# Patient Record
Sex: Male | Born: 2000 | Race: White | Hispanic: No | Marital: Married | Smoking: Never smoker
Health system: Southern US, Community
[De-identification: ages and names within clinical notes are randomized; demographics above are authoritative.]

## PROBLEM LIST (undated history)

## (undated) HISTORY — PX: TONSILLECTOMY: SUR1361

## (undated) HISTORY — PX: ADENOIDECTOMY: SUR15

---
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
## 2021-04-14 ENCOUNTER — Other Ambulatory Visit: Payer: Self-pay

## 2022-03-10 ENCOUNTER — Ambulatory Visit (INDEPENDENT_AMBULATORY_CARE_PROVIDER_SITE_OTHER): Payer: BC Managed Care – PPO

## 2022-03-10 ENCOUNTER — Ambulatory Visit
Admission: RE | Admit: 2022-03-10 | Discharge: 2022-03-10 | Disposition: A | Discharge status: Home / Self Care | Payer: BC Managed Care – PPO | Source: Ambulatory Visit | Attending: Internal Medicine | Admitting: Internal Medicine

## 2022-03-10 VITALS — BP 116/75 | HR 68 | Temp 98.0°F | Resp 18 | Wt 133.0 lb

## 2022-03-10 DIAGNOSIS — R5383 Other fatigue: Secondary | ICD-10-CM

## 2022-03-10 DIAGNOSIS — R198 Other specified symptoms and signs involving the digestive system and abdomen: Secondary | ICD-10-CM

## 2022-03-10 DIAGNOSIS — R1032 Left lower quadrant pain: Secondary | ICD-10-CM

## 2022-03-10 LAB — GASTROINTESTINAL PANEL BY PCR, STOOL (REPLACES STOOL CULTURE)

## 2022-03-10 LAB — URINALYSIS, ROUTINE W REFLEX MICROSCOPIC
Glucose, UA: NEGATIVE mg/dL
Hgb urine dipstick: NEGATIVE
Ketones, ur: NEGATIVE mg/dL
Leukocytes,Ua: NEGATIVE
Nitrite: NEGATIVE
Protein, ur: 30 mg/dL — AB
Specific Gravity, Urine: 1.02 (ref 1.005–1.030)
pH: 7 (ref 5.0–8.0)

## 2022-03-10 LAB — URINALYSIS, MICROSCOPIC (REFLEX)
Bacteria, UA: NONE SEEN
Squamous Epithelial / HPF: NONE SEEN (ref 0–5)

## 2022-03-10 MED ORDER — DICYCLOMINE HCL 10 MG PO CAPS: 10.0000 mg | ORAL_CAPSULE | Freq: Three times a day (TID) | ORAL | 0 refills | Status: DC

## 2022-03-10 NOTE — Discharge Instructions (Addendum)
Abdominal xray and urinalysis today were unremarkable.  Symptoms and exam are without immediate danger signs; a stool panel might be helpful and I have placed an order for this tomorrow.  Please establish care with a primary care physician--Dr Rosette Reveal upstairs in this building has appointments in the next few weeks.  He can help guide further evaluation.  Prescription for dicyclomine (for cramping) was sent to the pharmacy; take as needed.  Note for work today/tomorrow. ?

## 2022-03-10 NOTE — ED Notes (Signed)
Patient is resting comfortably. Family at bedside.  

## 2022-03-10 NOTE — ED Provider Notes (Signed)
?[facility] ? ? ? ?CSN: [id number] ?Arrival date & time: 03/10/22  1256 ? ? ?  ? ?History   ?Chief Complaint ?Chief Complaint  ?Patient presents with  ? Abdominal Pain  ?  Entered by patient  ? Appointment  ?  1300  ? ? ?HPI ?James Roth is a 21 y.o. male.  He presents today with a 3-week history of abdominal pain, initially was associated with a lot of nausea, located in epigastrium, has migrated over time to the left lower quadrant.  Stool pattern has been unusual for him since that time, with increased frequency and decreased consistency of bowel movements.   ? ?Stool consistency is somewhat variable, might be watery, might be solid.  Stools are not hard.  1-6 BMs per day.  Sometimes there is a little blood on the stool, and sometimes there is a little blood on the toilet paper.  He has not had this before now.   ? ?The left lower quadrant discomfort is sharp and crampy, fairly constant, but is worse around the time of a bowel movement.  Nothing really helps it.   ? ?He is not vomiting, and current level of nausea is not too different from baseline. ?No fever.  Not coughing, no runny/congested nose.  No dysuria, no urinary frequency, no urethral discharge. ? ?He is having fatigue.  He has poor appetite, and has lost 10 pounds in the last 3 weeks.  He has some achiness of both knees.  No rash or skin change.   ? ?He has no primary care provider ? ?His sister is having similar symptoms, but less severe.  She is being worked up for gluten sensitivity/allergy. ? ? ?Abdominal Pain ? ?History reviewed. No pertinent past medical history. ? ?Patient Active Problem List  ? Diagnosis Date Noted  ? Atypical chest pain 09/11/2020  ? ? ?Past Surgical History:  ?Procedure Laterality Date  ? ADENOIDECTOMY    ? TONSILLECTOMY    ? ? ? ? ? ?Home Medications   ? ?Takes no medications regularly ? ?Family History ?Family History  ?Problem Relation Age of Onset  ? Healthy Mother   ? Healthy Father   ? ? ?Social  History ?Social History  ? ?Tobacco Use  ? Smoking status: Never  ? Smokeless tobacco: Never  ?Vaping Use  ? Vaping Use: Never used  ?Substance Use Topics  ? Alcohol use: Not Currently  ? Drug use: Not Currently  ? ? ? ?Allergies   ?Penicillins ? ? ?Review of Systems ?Review of Systems  ?Gastrointestinal:  Positive for abdominal pain.   See HPI also ? ? ?Physical Exam ?Triage Vital Signs ?ED Triage Vitals  ?Enc Vitals Group  ?   BP 03/10/22 1431 116/75  ?   Pulse Rate 03/10/22 1431 68  ?   Resp 03/10/22 1431 18  ?   Temp 03/10/22 1431 98 ?F (36.7 ?C)  ?   Temp Source 03/10/22 1431 Oral  ?   SpO2 03/10/22 1431 100 %  ?   Weight 03/10/22 1430 133 lb (60.3 kg)  ?   Height --   ?   Pain Score 03/10/22 1430 5  ?   Pain Loc --   ? ?Updated Vital Signs ?BP 116/75 (BP Location: Left Arm)   Pulse 68   Temp 98 ?F (36.7 ?C) (Oral)   Resp 18   Wt 60.3 kg   SpO2 100%   BMI 19.08 kg/m?  ? ?Physical Exam ?Constitutional:   ?  General: He is not in acute distress. ?   Appearance: He is not ill-appearing or toxic-appearing.  ?   Comments: Good hygiene  ?Eyes:  ?   Conjunctiva/sclera:  ?   Right eye: Right conjunctiva is not injected. No exudate. ?   Left eye: Left conjunctiva is not injected. No exudate. ?   Comments:  Conjugate gaze observed  ?Cardiovascular:  ?   Rate and Rhythm: Normal rate and regular rhythm.  ?Pulmonary:  ?   Effort: Pulmonary effort is normal. No respiratory distress.  ?   Breath sounds: No wheezing or rhonchi.  ?Abdominal:  ?   General: There is no distension.  ?   Palpations: Abdomen is soft.  ?   Tenderness: There is no guarding or rebound.  ? ? ?   Comments: Mild tenderness to deep palpation as marked in the diagram, in the right lower quadrant  ?Musculoskeletal:  ?   Cervical back: Neck supple.  ?   Comments: Walked into the urgent care independently  ?Skin: ?   General: Skin is warm and dry.  ?   Comments: No cyanosis  ?Neurological:  ?   Mental Status: He is alert.  ?   Comments: Face is  symmetric, speech is clear, coherent, logical  ? ? ?UC Treatments / Results  ?Labs ?(all labs ordered are listed, but only abnormal results are displayed) ?Labs Reviewed  ?URINALYSIS, ROUTINE W REFLEX MICROSCOPIC - Abnormal; Notable for the following components:  ?    Result Value  ? Bilirubin Urine SMALL (*)   ? Protein, ur 30 (*)   ? All other components within normal limits  ?GASTROINTESTINAL PANEL BY PCR, STOOL (REPLACES STOOL CULTURE)  ?URINALYSIS, MICROSCOPIC (REFLEX)  ? ?Urinalysis 0-5 RBC, 0-5 WBC, no bacteria, no squames, positive amorphous crystals. ? ?GI Panel (stool)--negative for all assessed pathogens ? ?EKG ?NA ? ?Radiology ?DG Abd Acute W/Chest ? ?Result Date: 03/10/2022 ?CLINICAL DATA:  Left lower quadrant pain for 3 weeks, fatigue, weight loss EXAM: DG ABDOMEN ACUTE WITH 1 VIEW CHEST COMPARISON:  09/10/2020 FINDINGS: Supine and upright frontal views of the abdomen and pelvis are obtained as well as an upright frontal view of the chest. Cardiac silhouette is unremarkable. No airspace disease, effusion, or pneumothorax. Bowel gas pattern is unremarkable without obstruction or ileus. No masses or abnormal calcifications. No free gas in the greater peritoneal sac. There are no acute or destructive bony lesions. IMPRESSION: 1. Unremarkable abdominal series. Electronically Signed   By: Sharlet Salina M.D.   On: 03/10/2022 15:29   ? ?Procedures ?Procedures (including critical care time) ?NA ? ?Medications Ordered in UC ?Medications - No data to display ?NA ? ?Final Clinical Impressions(s) / UC Diagnoses  ? ?Final diagnoses:  ?Left lower quadrant abdominal pain  ?Change in bowel movement  ? ? ? ?Discharge Instructions   ? ?  ?Abdominal xray and urinalysis today were unremarkable.  Symptoms and exam are without immediate danger signs; a stool panel might be helpful and I have placed an order for this tomorrow.  Please establish care with a primary care physician--Dr Joseph Berkshire upstairs in this building  has appointments in the next few weeks.  He can help guide further evaluation.  Prescription for dicyclomine (for cramping) was sent to the pharmacy; take as needed.  Note for work today/tomorrow. ? ? ? ? ?ED Prescriptions   ? ? Medication Sig Dispense Auth. Provider  ? dicyclomine (BENTYL) 10 MG capsule Take 1 capsule (10 mg total) by  mouth 3 (three) times daily before meals for 90 doses. 90 capsule Isa Rankin, MD  ? ?  ? ?PDMP not reviewed this encounter. ?  ?Isa Rankin, MD ?03/11/22 1258 ? ?

## 2022-03-10 NOTE — ED Triage Notes (Signed)
Patient presents to Urgent Care with complaints of abdominal pain, intermittent diarrhea, weight loss about 10 lbs, fatigue x 3 weeks ago. Pt states pain started at the center of abdominal area and now moved to LLQ radiating to the left flank area. Not treating symptoms with OTC meds.  ? ?Denies nausea, fever, or urinary symptoms.  ?

## 2022-03-16 ENCOUNTER — Telehealth: Payer: Self-pay | Admitting: Family Medicine

## 2022-03-16 ENCOUNTER — Telehealth: Payer: Self-pay

## 2022-03-16 NOTE — Telephone Encounter (Signed)
Spoke to mom concerning pt's status. He is still having abdominal pain stating that "it feels like someone punching me in my gut" when the pain comes. She has been advised to go to [facility] for further eval. Abdominal pain possible CBC and CT scan. Mom voiced understanding ?

## 2022-03-16 NOTE — Telephone Encounter (Signed)
Copied from CRM [id number]. Topic: General - Other ?>> Mar 16, 2022 11:09 AM Marylen Ponto wrote: ?Reason for CRM: Lanier Prude requests that Delice Bison return her call at [phone] ?

## 2022-05-08 ENCOUNTER — Encounter: Payer: Self-pay | Admitting: Family Medicine

## 2022-05-08 ENCOUNTER — Ambulatory Visit: Payer: 59 | Admitting: Family Medicine

## 2022-05-08 VITALS — BP 110/78 | HR 78 | Ht 69.0 in | Wt 136.6 lb

## 2022-05-08 DIAGNOSIS — Z0000 Encounter for general adult medical examination without abnormal findings: Secondary | ICD-10-CM

## 2022-05-08 DIAGNOSIS — Z1159 Encounter for screening for other viral diseases: Secondary | ICD-10-CM

## 2022-05-08 DIAGNOSIS — Z13220 Encounter for screening for lipoid disorders: Secondary | ICD-10-CM | POA: Diagnosis not present

## 2022-05-08 DIAGNOSIS — N50819 Testicular pain, unspecified: Secondary | ICD-10-CM

## 2022-05-08 DIAGNOSIS — R7989 Other specified abnormal findings of blood chemistry: Secondary | ICD-10-CM

## 2022-05-08 DIAGNOSIS — Z114 Encounter for screening for human immunodeficiency virus [HIV]: Secondary | ICD-10-CM | POA: Diagnosis not present

## 2022-05-08 NOTE — Assessment & Plan Note (Signed)
Atraumatic onset for a few weeks, localized left scrotal, can feel "bump", pain not aggravated by physical activity and mot noted when sitting inactive. Denies any preceding new exposures or activities to symptoms. No dysuria or masses noted. His findings are most consistent with possible spermatocele vs epididymal irritation. Plan for supportive care and he is to contact for any worsening or recalcitrant symptoms.

## 2022-05-08 NOTE — Patient Instructions (Addendum)
-   Obtain fasting labs with orders provided (can have water or black coffee but otherwise no food or drink x 8 hours before labs) - Can use heat alternating with ice and OTC medications for scrotal pain - If worsening or not improving, contact our office - Review information provided - Attend eye doctor annually, dentist every 6 months, work towards or maintain 30 minutes of moderate intensity physical activity at least 5 days per week, and consume a balanced diet - Return in 1 year for physical - Contact us for any questions between now and then

## 2022-05-08 NOTE — Assessment & Plan Note (Signed)
Annual examination completed, risk stratification labs ordered, anticipatory guidance provided.  We will follow labs once resulted. We will obtain his outside records to determine HPV and TDAP vaccination status.

## 2022-05-08 NOTE — Progress Notes (Signed)
Annual Physical Exam Visit  Patient Information:  Patient ID: James Roth, male DOB: 2001/09/15 Age: 21 y.o. MRN: [id number]   Subjective:   CC: Annual Physical Exam  HPI:  James Roth is here for their annual physical.  I reviewed the past medical history, family history, social history, surgical history, and allergies today and changes were made as necessary.  Please see the problem list section below for additional details.  Past Medical History: History reviewed. No pertinent past medical history. Past Surgical History: Past Surgical History:  Procedure Laterality Date   ADENOIDECTOMY  2008   TONSILLECTOMY  2008   Family History: Family History  Problem Relation Age of Onset   Healthy Mother    Healthy Father    Irritable bowel syndrome Sister    Allergies: Allergies  Allergen Reactions   Penicillins Hives   Health Maintenance: Health Maintenance  Topic Date Due   HPV VACCINES (1 - Male 2-dose series) Never done   Hepatitis C Screening  Never done   TETANUS/TDAP  Never done   INFLUENZA VACCINE  06/09/2022   HIV Screening  Completed    HM Colonoscopy     This patient has no relevant Health Maintenance data.      Medications: Current Outpatient Medications on File Prior to Visit  Medication Sig Dispense Refill   dicyclomine (BENTYL) 10 MG capsule Take 1 capsule (10 mg total) by mouth 3 (three) times daily before meals for 90 doses. 90 capsule 0   VIBERZI 75 MG TABS Take 1 tablet by mouth 2 (two) times daily.     No current facility-administered medications on file prior to visit.    Review of Systems: No headache, visual changes, nausea, vomiting, diarrhea, constipation, dizziness, abdominal pain, skin rash, fevers, chills, night sweats, swollen lymph nodes, weight loss, chest pain, body aches, joint swelling, muscle aches, shortness of breath, mood changes, visual or auditory hallucinations reported.  Objective:   Vitals:   05/08/22 0902   BP: 110/78  Pulse: 78  SpO2: 98%   Vitals:   05/08/22 0902  Weight: 136 lb 9.6 oz (62 kg)  Height: 5\' 9"  (1.753 m)   Body mass index is 20.17 kg/m.  General: Well Developed, well nourished, and in no acute distress.  Neuro: Alert and oriented x3, extra-ocular muscles intact, sensation grossly intact. Cranial nerves II through XII are grossly intact, motor, sensory, and coordinative functions are intact. HEENT: Normocephalic, atraumatic, pupils equal round reactive to light, neck supple, no masses, no lymphadenopathy, thyroid nonpalpable. Oropharynx, nasopharynx, external ear canals are unremarkable. Skin: Warm and dry, no rashes noted.  Cardiac: Regular rate and rhythm, no murmurs rubs or gallops. No peripheral edema. Pulses symmetric. Respiratory: Clear to auscultation bilaterally. Not using accessory muscles, speaking in full sentences.  Abdominal: Soft, nontender, nondistended, positive bowel sounds, no masses, no organomegaly. Genitourinary: No abnormalities to inspection. No lesions noted. No testicular masses, mild tenderness along left spermatic cords, no hernias. Musculoskeletal: Shoulder, elbow, wrist, hip, knee, ankle stable, and with full range of motion.  Impression and Recommendations:   The patient was counselled, risk factors were discussed, and anticipatory guidance given.  Problem List Items Addressed This Visit       Other   Annual physical exam - Primary    Annual examination completed, risk stratification labs ordered, anticipatory guidance provided.  We will follow labs once resulted. We will obtain his outside records to determine HPV and TDAP vaccination status.  Relevant Orders   Apo A1 + B + Ratio   CBC   Comprehensive metabolic panel   Hepatitis C antibody   HIV Antibody (routine testing w rflx)   Lipid panel   TSH   VITAMIN D 25 Hydroxy (Vit-D Deficiency, Fractures)   Epididymal pain    Atraumatic onset for a few weeks, localized left  scrotal, can feel "bump", pain not aggravated by physical activity and mot noted when sitting inactive. Denies any preceding new exposures or activities to symptoms. No dysuria or masses noted. His findings are most consistent with possible spermatocele vs epididymal irritation. Plan for supportive care and he is to contact for any worsening or recalcitrant symptoms.      Other Visit Diagnoses     Screening for HIV (human immunodeficiency virus)       Relevant Orders   HIV Antibody (routine testing w rflx)   Screening for lipoid disorders       Relevant Orders   Apo A1 + B + Ratio   Lipid panel   Need for hepatitis C screening test       Relevant Orders   Hepatitis C antibody   Low serum vitamin D       Relevant Orders   VITAMIN D 25 Hydroxy (Vit-D Deficiency, Fractures)        Orders & Medications Medications: No orders of the defined types were placed in this encounter.  Orders Placed This Encounter  Procedures   Apo A1 + B + Ratio   CBC   Comprehensive metabolic panel   Hepatitis C antibody   HIV Antibody (routine testing w rflx)   Lipid panel   TSH   VITAMIN D 25 Hydroxy (Vit-D Deficiency, Fractures)     Return in about 1 year (around 05/09/2023) for Annual Physical.    Jerrol Banana, MD   Primary Care Sports Medicine [facility]  [facility]

## 2022-05-13 LAB — COMPREHENSIVE METABOLIC PANEL
ALT: 7 IU/L (ref 0–44)
AST: 13 IU/L (ref 0–40)
Albumin/Globulin Ratio: 2.8 — ABNORMAL HIGH (ref 1.2–2.2)
Albumin: 5.1 g/dL (ref 4.1–5.2)
Alkaline Phosphatase: 76 IU/L (ref 51–125)
BUN/Creatinine Ratio: 19 (ref 9–20)
BUN: 18 mg/dL (ref 6–20)
Bilirubin Total: 0.4 mg/dL (ref 0.0–1.2)
CO2: 24 mmol/L (ref 20–29)
Calcium: 10 mg/dL (ref 8.7–10.2)
Chloride: 102 mmol/L (ref 96–106)
Creatinine, Ser: 0.94 mg/dL (ref 0.76–1.27)
Globulin, Total: 1.8 g/dL (ref 1.5–4.5)
Glucose: 77 mg/dL (ref 70–99)
Potassium: 4.3 mmol/L (ref 3.5–5.2)
Sodium: 141 mmol/L (ref 134–144)
Total Protein: 6.9 g/dL (ref 6.0–8.5)
eGFR: 120 mL/min/{1.73_m2} (ref >59)

## 2022-05-13 LAB — LIPID PANEL
Chol/HDL Ratio: 2.4 ratio (ref 0.0–5.0)
Cholesterol, Total: 141 mg/dL (ref 100–169)
HDL: 58 mg/dL (ref >39)
LDL Chol Calc (NIH): 73 mg/dL (ref 0–109)
Triglycerides: 43 mg/dL (ref 0–89)
VLDL Cholesterol Cal: 10 mg/dL (ref 5–40)

## 2022-05-13 LAB — CBC
Hematocrit: 43.8 % (ref 37.5–51.0)
Hemoglobin: 15.1 g/dL (ref 13.0–17.7)
MCH: 29.4 pg (ref 26.6–33.0)
MCHC: 34.5 g/dL (ref 31.5–35.7)
MCV: 85 fL (ref 79–97)
Platelets: 281 10*3/uL (ref 150–450)
RBC: 5.14 x10E6/uL (ref 4.14–5.80)
RDW: 12.5 % (ref 11.6–15.4)
WBC: 4.9 10*3/uL (ref 3.4–10.8)

## 2022-05-13 LAB — APO A1 + B + RATIO
Apolipo. B/A-1 Ratio: 0.5 ratio (ref 0.0–0.7)
Apolipoprotein A-1: 126 mg/dL (ref 101–178)
Apolipoprotein B: 66 mg/dL (ref <90)

## 2022-05-13 LAB — HEPATITIS C ANTIBODY: Hep C Virus Ab: NONREACTIVE

## 2022-05-13 LAB — TSH: TSH: 1.57 u[IU]/mL (ref 0.450–4.500)

## 2022-05-13 LAB — VITAMIN D 25 HYDROXY (VIT D DEFICIENCY, FRACTURES): Vit D, 25-Hydroxy: 54.7 ng/mL (ref 30.0–100.0)

## 2022-05-13 LAB — HIV ANTIBODY (ROUTINE TESTING W REFLEX): HIV Screen 4th Generation wRfx: NONREACTIVE

## 2022-09-20 ENCOUNTER — Ambulatory Visit
Admission: EM | Admit: 2022-09-20 | Discharge: 2022-09-20 | Disposition: A | Discharge status: Home / Self Care | Payer: 59 | Attending: Family Medicine | Admitting: Family Medicine

## 2022-09-20 ENCOUNTER — Encounter: Payer: Self-pay | Admitting: Emergency Medicine

## 2022-09-20 DIAGNOSIS — E860 Dehydration: Secondary | ICD-10-CM | POA: Diagnosis not present

## 2022-09-20 DIAGNOSIS — K529 Noninfective gastroenteritis and colitis, unspecified: Secondary | ICD-10-CM | POA: Diagnosis not present

## 2022-09-20 MED ORDER — ONDANSETRON 4 MG PO TBDP: 4.0000 mg | ORAL_TABLET | Freq: Three times a day (TID) | ORAL | 1 refills | Status: DC | PRN

## 2022-09-20 NOTE — ED Provider Notes (Signed)
[facility]    CSN: [id number] Arrival date & time: 09/20/22  1119      History   Chief Complaint Chief Complaint  Patient presents with   Emesis   Diarrhea    HPI 21 year old male presents with nausea, vomiting, diarrhea.  Started at 3 PM yesterday.  He has had nausea, vomiting, diarrhea.  He has had some periumbilical abdominal pain.  No fever.  He has had chills.  He is also had some cough and congestion.  No vomiting since early this morning.  He has had diarrhea today.  Pain mild to moderate.  No relieving factors.  No other reported symptoms.  No other complaints   Home Medications    Prior to Admission medications   Medication Sig Start Date End Date Taking? Authorizing Provider  ondansetron (ZOFRAN-ODT) 4 MG disintegrating tablet Take 1-2 tablets (4-8 mg total) by mouth every 8 (eight) hours as needed for nausea or vomiting. 09/20/22  Yes Tommie Sams, DO    Family History Family History  Problem Relation Age of Onset   Healthy Mother    Healthy Father    Irritable bowel syndrome Sister     Social History Social History   Tobacco Use   Smoking status: Never   Smokeless tobacco: Never  Vaping Use   Vaping Use: Never used  Substance Use Topics   Alcohol use: Not Currently   Drug use: Not Currently     Allergies   Penicillins   Review of Systems Review of Systems  Constitutional:  Positive for chills. Negative for fever.  Gastrointestinal:  Positive for abdominal pain, diarrhea, nausea and vomiting.   Physical Exam Triage Vital Signs ED Triage Vitals  Enc Vitals Group     BP 09/20/22 1141 121/77     Pulse Rate 09/20/22 1141 (!) 115     Resp 09/20/22 1141 14     Temp 09/20/22 1141 98.6 F (37 C)     Temp Source 09/20/22 1141 Oral     SpO2 09/20/22 1141 96 %     Weight 09/20/22 1139 122 lb (55.3 kg)     Height 09/20/22 1139 5\' 9"  (1.753 m)     Head Circumference --      Peak Flow --      Pain Score 09/20/22 1139 5     Pain  Loc --      Pain Edu? --      Excl. in GC? --    Updated Vital Signs BP 121/77 (BP Location: Left Arm)   Pulse (!) 115   Temp 98.6 F (37 C) (Oral)   Resp 14   Ht 5\' 9"  (1.753 m)   Wt 55.3 kg   SpO2 96%   BMI 18.02 kg/m   Visual Acuity Right Eye Distance:   Left Eye Distance:   Bilateral Distance:    Right Eye Near:   Left Eye Near:    Bilateral Near:     Physical Exam Vitals and nursing note reviewed.  Constitutional:      General: He is not in acute distress.    Appearance: Normal appearance.  HENT:     Head: Normocephalic and atraumatic.     Mouth/Throat:     Pharynx: Oropharynx is clear.  Cardiovascular:     Rate and Rhythm: Normal rate and regular rhythm.  Pulmonary:     Effort: Pulmonary effort is normal.     Breath sounds: Normal breath sounds. No wheezing, rhonchi or rales.  Abdominal:     General: There is no distension.     Palpations: Abdomen is soft.     Tenderness: There is no abdominal tenderness.  Neurological:     Mental Status: He is alert.    UC Treatments / Results  Labs (all labs ordered are listed, but only abnormal results are displayed) Labs Reviewed - No data to display  EKG   Radiology No results found.  Procedures Procedures (including critical care time)  Medications Ordered in UC Medications - No data to display  Initial Impression / Assessment and Plan / UC Course  I have reviewed the triage vital signs and the nursing notes.  Pertinent labs & imaging results that were available during my care of the patient were reviewed by me and considered in my medical decision making (see chart for details).    21 year old male presents with gastroenteritis.  Mild dehydration.  Zofran as needed.  Push fluids.  Supportive care.  Work note given.  Final Clinical Impressions(s) / UC Diagnoses   Final diagnoses:  Gastroenteritis  Dehydration   Discharge Instructions   None    ED Prescriptions     Medication Sig Dispense  Auth. Provider   ondansetron (ZOFRAN-ODT) 4 MG disintegrating tablet Take 1-2 tablets (4-8 mg total) by mouth every 8 (eight) hours as needed for nausea or vomiting. 30 tablet Tommie Sams, DO      PDMP not reviewed this encounter.   Tommie Sams, [place] 09/20/22 [phone]

## 2022-09-20 NOTE — ED Triage Notes (Signed)
Patient c/o N/V/D that started around 3 pm yesterday.  Patient reports some mid upper abdominal pain. Patient reports some chills.  Patient also reports cough and congestions for a week.

## 2022-11-02 IMAGING — CR DG ABDOMEN ACUTE W/ 1V CHEST
4 series · 4 of 4 positions shown · non-contrast
Comparison: 09/10/2020

CLINICAL DATA: Left lower quadrant pain for 3 weeks, fatigue,
weight loss

EXAM:
DG ABDOMEN ACUTE WITH 1 VIEW CHEST

[chest pa]
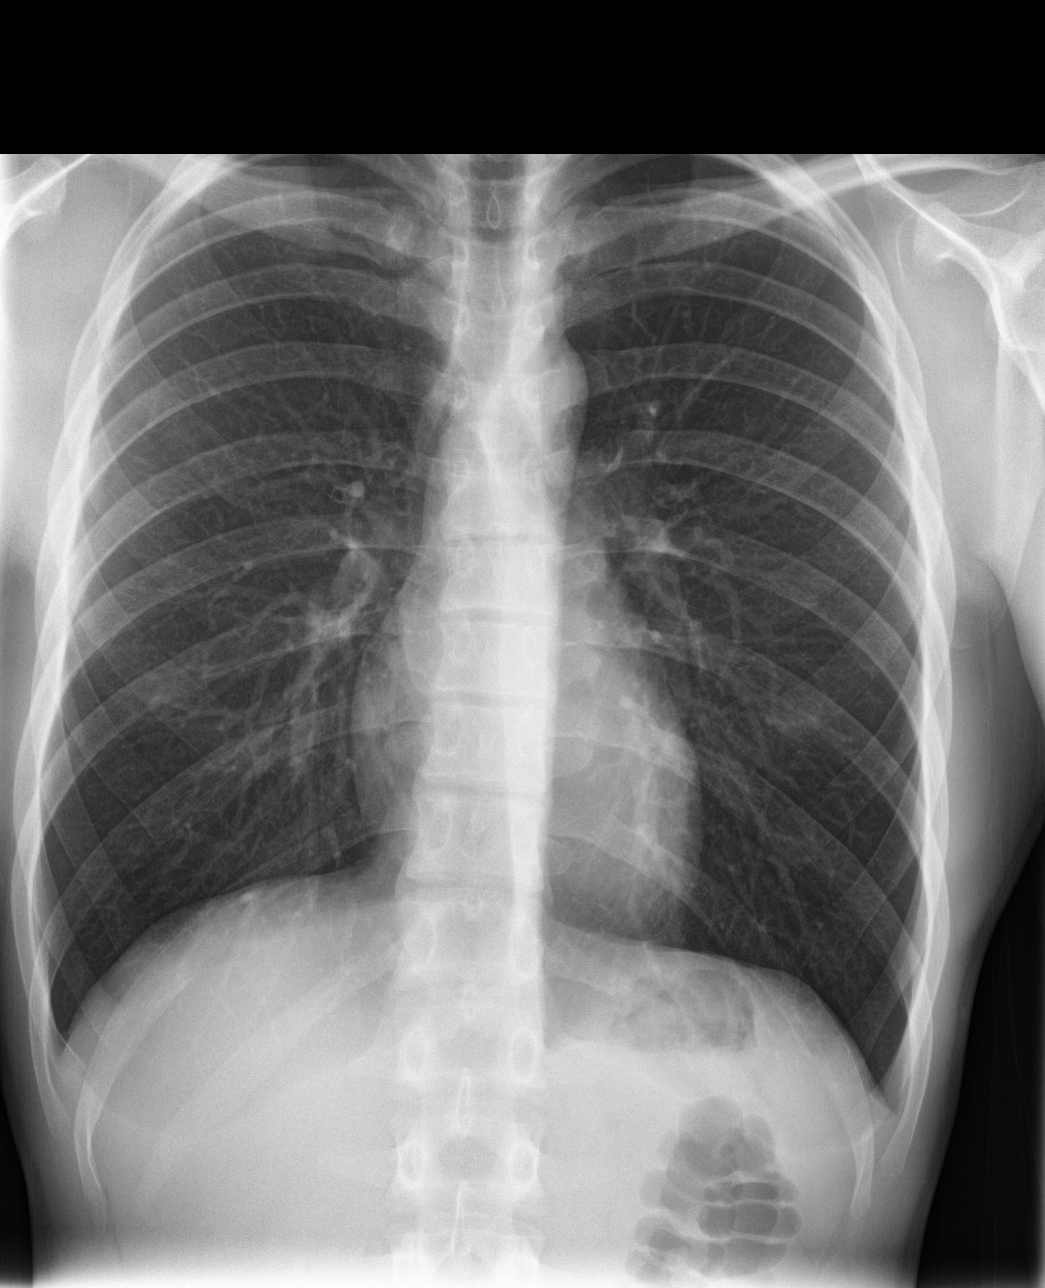

[abdomen erect (1 of 2)]
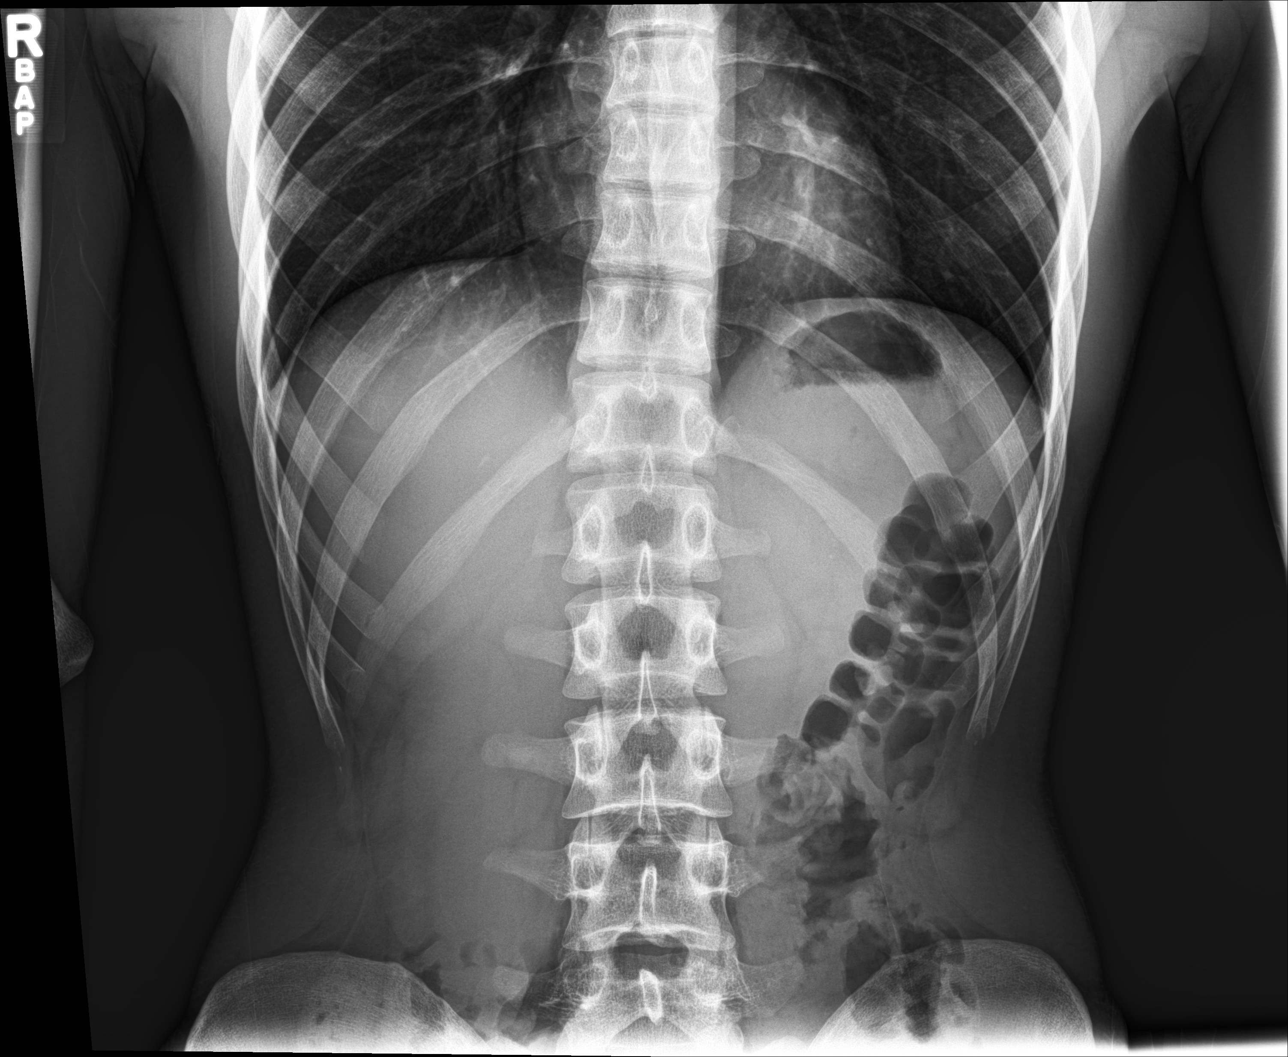

[abdomen erect (2 of 2)]
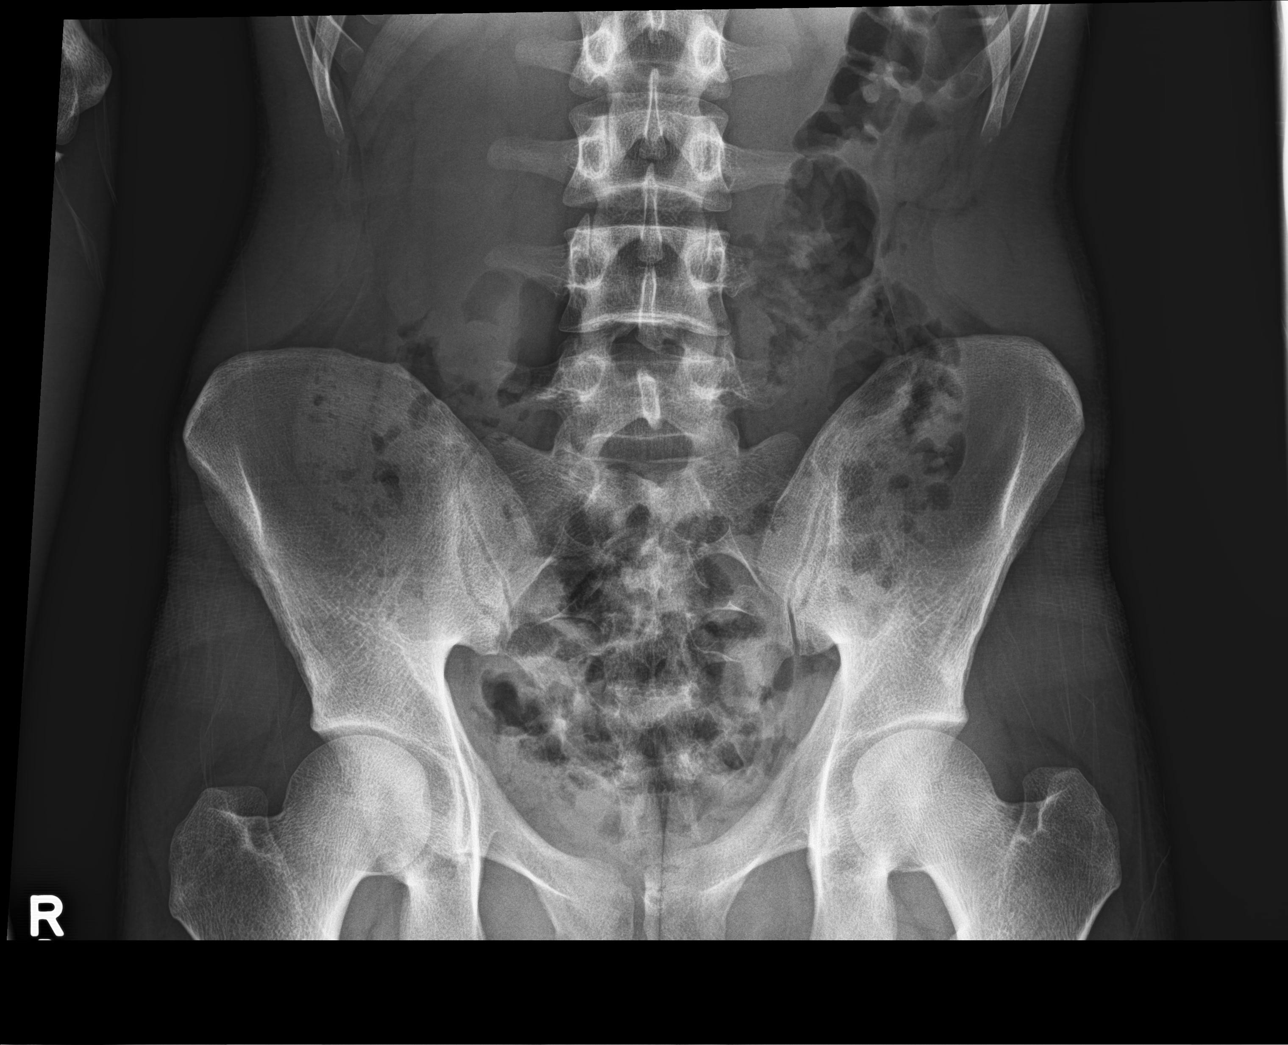

[abdomen supine]
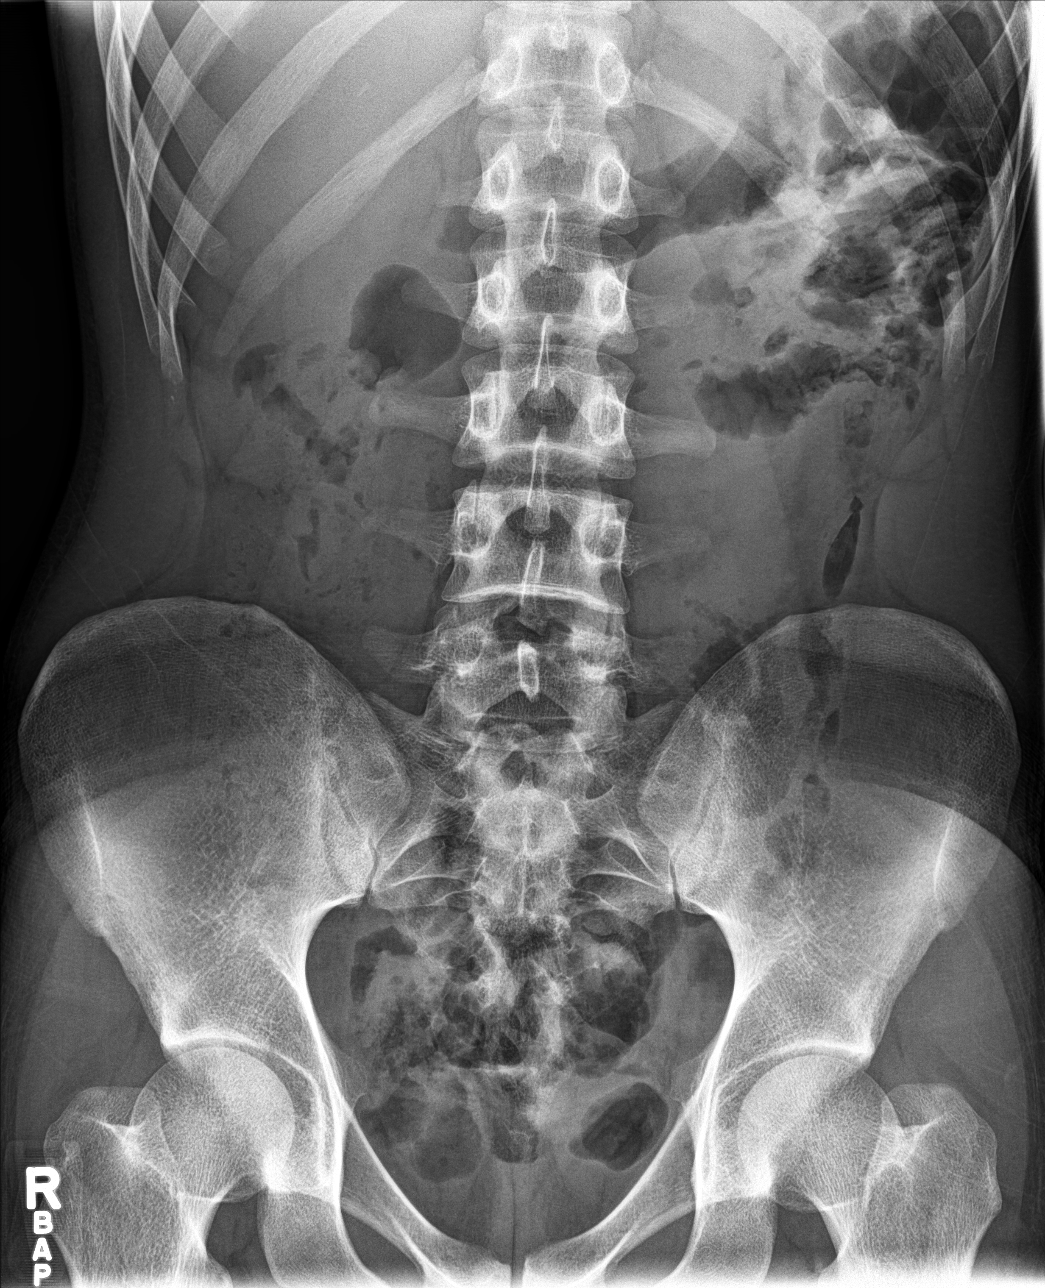

[4 of 4 positions shown; findings below may reference images not displayed]

FINDINGS: Supine and upright frontal views of the abdomen and pelvis are
obtained as well as an upright frontal view of the chest. Cardiac
silhouette is unremarkable. No airspace disease, effusion, or
pneumothorax.

Bowel gas pattern is unremarkable without obstruction or ileus. No
masses or abnormal calcifications. No free gas in the greater
peritoneal sac.

There are no acute or destructive bony lesions.
IMPRESSION: 1. Unremarkable abdominal series.

## 2023-04-28 ENCOUNTER — Ambulatory Visit
Admission: EM | Admit: 2023-04-28 | Discharge: 2023-04-28 | Disposition: A | Discharge status: Home / Self Care | Payer: 59 | Attending: Emergency Medicine | Admitting: Emergency Medicine

## 2023-04-28 DIAGNOSIS — L600 Ingrowing nail: Secondary | ICD-10-CM

## 2023-04-28 MED ORDER — DOXYCYCLINE HYCLATE 100 MG PO CAPS: 100.0000 mg | ORAL_CAPSULE | Freq: Two times a day (BID) | ORAL | 0 refills | Status: DC

## 2023-04-28 NOTE — ED Provider Notes (Signed)
[facility]    CSN: [id number] Arrival date & time: 04/28/23  1423      History   Chief Complaint No chief complaint on file.   HPI James Roth is a 22 y.o. male.   HPI  22 year old male with no significant past medical history presents for evaluation of pain and drainage from his right big toe that started 5 days ago.  History reviewed. No pertinent past medical history.  Patient Active Problem List   Diagnosis Date Noted   Annual physical exam 05/08/2022   Epididymal pain 05/08/2022   Atypical chest pain 09/11/2020    Past Surgical History:  Procedure Laterality Date   ADENOIDECTOMY  2008   TONSILLECTOMY  2008       Home Medications    Prior to Admission medications   Medication Sig Start Date End Date Taking? Authorizing Provider  doxycycline (VIBRAMYCIN) 100 MG capsule Take 1 capsule (100 mg total) by mouth 2 (two) times daily. 04/28/23  Yes Becky Augusta, NP    Family History Family History  Problem Relation Age of Onset   Healthy Mother    Healthy Father    Irritable bowel syndrome Sister     Social History Social History   Tobacco Use   Smoking status: Never   Smokeless tobacco: Never  Vaping Use   Vaping Use: Never used  Substance Use Topics   Alcohol use: Not Currently   Drug use: Not Currently     Allergies   Penicillins   Review of Systems Review of Systems  Constitutional:  Negative for fever.  Musculoskeletal:  Positive for myalgias.  Skin:  Positive for color change.     Physical Exam Triage Vital Signs ED Triage Vitals  Enc Vitals Group     BP      Pulse      Resp      Temp      Temp src      SpO2      Weight      Height      Head Circumference      Peak Flow      Pain Score      Pain Loc      Pain Edu?      Excl. in GC?    No data found.  Updated Vital Signs BP 106/70 (BP Location: Right Arm)   Pulse 70   Temp 98.4 F (36.9 C) (Oral)   SpO2 97%   Visual Acuity Right Eye Distance:    Left Eye Distance:   Bilateral Distance:    Right Eye Near:   Left Eye Near:    Bilateral Near:     Physical Exam Vitals and nursing note reviewed.  Constitutional:      Appearance: Normal appearance. He is not ill-appearing.  HENT:     Head: Normocephalic and atraumatic.  Musculoskeletal:        General: Swelling and tenderness present.  Skin:    General: Skin is warm and dry.     Capillary Refill: Capillary refill takes less than 2 seconds.     Findings: Erythema present.  Neurological:     General: No focal deficit present.     Mental Status: He is alert and oriented to person, place, and time.      UC Treatments / Results  Labs (all labs ordered are listed, but only abnormal results are displayed) Labs Reviewed - No data to display  EKG   Radiology No  results found.  Procedures Procedures (including critical care time)  Medications Ordered in UC Medications - No data to display  Initial Impression / Assessment and Plan / UC Course  I have reviewed the triage vital signs and the nursing notes.  Pertinent labs & imaging results that were available during my care of the patient were reviewed by me and considered in my medical decision making (see chart for details).   Patient is a nontoxic-appearing 22 year old male presenting for evaluation of possible infection and ingrown toenail to his right great toe . Patient seen image above, there is serosanguineous drainage from the medial edge of the right great toe cuticle.  The toenail is cut back at quite an acute angle and there is no lip to get a hold of to lift the nail edge.  The cuticle itself is also erythematous, warm to touch, and tender.  I will start the patient on doxycycline 100 mg twice daily for 10 days to treat the soft tissue infection and refer him to [facility] for definitive management of his ingrown toenail.  I have also instructed him to soak his foot in warm water and Epsom salts  twice daily to help dry out any infection that is harboring in the tissues.  Return precautions reviewed.  Patient denies need for work note.  Final Clinical Impressions(s) / UC Diagnoses   Final diagnoses:  Ingrown toenail     Discharge Instructions      Take the Doxycycline twice daily with food for 10 days.  Soak your toe in warm water and Epsom salts 2-3 times a day to help facilitate drainage.  Keep a dressing on your finger until the drainage has stopped.  You can use over-the-counter Tylenol and ibuprofen according to the package instructions as needed for pain.  I have referred you to [facility] for further evaluation and treatment.  Return for reevaluation if you develop any increased redness, swelling, drainage, or red streaks going up your finger, or fever.      ED Prescriptions     Medication Sig Dispense Auth. Provider   doxycycline (VIBRAMYCIN) 100 MG capsule Take 1 capsule (100 mg total) by mouth 2 (two) times daily. 20 capsule Becky Augusta, NP      PDMP not reviewed this encounter.   Becky Augusta, NP 04/28/23 1501

## 2023-04-28 NOTE — ED Triage Notes (Signed)
Pt presents to UC c/o possible infected RT big toenail x5 days, pt states he does have pus coming out from it here and there.

## 2023-04-28 NOTE — Discharge Instructions (Signed)
Take the Doxycycline twice daily with food for 10 days.  Soak your toe in warm water and Epsom salts 2-3 times a day to help facilitate drainage.  Keep a dressing on your finger until the drainage has stopped.  You can use over-the-counter Tylenol and ibuprofen according to the package instructions as needed for pain.  I have referred you to [facility] for further evaluation and treatment.  Return for reevaluation if you develop any increased redness, swelling, drainage, or red streaks going up your finger, or fever.

## 2023-04-30 ENCOUNTER — Ambulatory Visit: Payer: 59 | Admitting: Podiatry

## 2023-04-30 ENCOUNTER — Encounter: Payer: Self-pay | Admitting: Podiatry

## 2023-04-30 VITALS — BP 111/77 | HR 63

## 2023-04-30 DIAGNOSIS — L600 Ingrowing nail: Secondary | ICD-10-CM | POA: Diagnosis not present

## 2023-04-30 NOTE — Progress Notes (Signed)
   Chief Complaint  Patient presents with   Nail Problem    "I have an infected ingrown." N - toenail pain L - hallux right D - 1 week O - gradually worse C - sharp pain, pus, tender, bleeding A - pressure, walking, and standing T - epsom salt, Neosporin salve, Urgent Care, Doxycycline    Subjective: Patient presents today for evaluation of pain to the medial border left great toe. Patient is concerned for possible ingrown nail.  It is very sensitive to touch.  Patient presents today for further treatment and evaluation.  No past medical history on file.  Objective:  General: Well developed, nourished, in no acute distress, alert and oriented x3   Dermatology: Skin is warm, dry and supple bilateral.  Medial border left great toe is tender with evidence of an ingrowing nail. Pain on palpation noted to the border of the nail fold. The remaining nails appear unremarkable at this time. There are no open sores, lesions.  Vascular: DP and PT pulses palpable.  No clinical evidence of vascular compromise  Neruologic: Grossly intact via light touch bilateral.  Musculoskeletal: No pedal deformity noted  Assesement: #1 Paronychia with ingrowing nail medial border left  Plan of Care:  1. Patient evaluated.  2. Discussed treatment alternatives and plan of care. Explained nail avulsion procedure and post procedure course to patient. 3. Patient opted for permanent partial nail avulsion of the ingrown portion of the nail.  4. Prior to procedure, local anesthesia infiltration utilized using 3 ml of a 50:50 mixture of 2% plain lidocaine and 0.5% plain marcaine in a normal hallux block fashion and a betadine prep performed.  5. Partial permanent nail avulsion with chemical matrixectomy performed using 3x30sec applications of phenol followed by alcohol flush.  6. Light dressing applied.  Post care instructions provided 7.  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 Fiance is  Maddie present today. Getting married February 2025.   Felecia Shelling, DPM [facility]  Dr. Felecia Shelling, DPM    [address] [place], [place] [address]                Office [phone]  Fax [phone]

## 2023-05-11 ENCOUNTER — Ambulatory Visit (INDEPENDENT_AMBULATORY_CARE_PROVIDER_SITE_OTHER): Payer: 59 | Admitting: Family Medicine

## 2023-05-11 ENCOUNTER — Encounter: Payer: Self-pay | Admitting: Family Medicine

## 2023-05-11 VITALS — BP 110/70 | HR 70 | Ht 70.0 in | Wt 133.0 lb

## 2023-05-11 DIAGNOSIS — Z13220 Encounter for screening for lipoid disorders: Secondary | ICD-10-CM | POA: Diagnosis not present

## 2023-05-11 DIAGNOSIS — Z681 Body mass index (BMI) 19 or less, adult: Secondary | ICD-10-CM | POA: Insufficient documentation

## 2023-05-11 DIAGNOSIS — Z0000 Encounter for general adult medical examination without abnormal findings: Secondary | ICD-10-CM | POA: Diagnosis not present

## 2023-05-11 DIAGNOSIS — Z23 Encounter for immunization: Secondary | ICD-10-CM

## 2023-05-11 DIAGNOSIS — Z1159 Encounter for screening for other viral diseases: Secondary | ICD-10-CM

## 2023-05-11 NOTE — Patient Instructions (Addendum)
-   Obtain fasting labs with orders provided (can have water or black coffee but otherwise no food or drink x 8 hours before labs) - Review information provided - Attend eye doctor annually, dentist every 6 months, work towards or maintain 30 minutes of moderate intensity physical activity at least 5 days per week, and consume a balanced diet - Return in 1 year for physical - Contact us for any questions between now and then  Additionally: - Consider creatine supplementation - Focus on stress reduction techniques

## 2023-05-11 NOTE — Assessment & Plan Note (Signed)
Administered today.

## 2023-05-11 NOTE — Progress Notes (Signed)
Annual Physical Exam Visit  Patient Information:  Patient ID: James Roth, male DOB: 2000-12-07 Age: 22 y.o. MRN: [id number]   Subjective:   CC: Annual Physical Exam  HPI:  James Roth is here for their annual physical.  I reviewed the past medical history, family history, social history, surgical history, and allergies today and changes were made as necessary.  Please see the problem list section below for additional details.  Past Medical History: History reviewed. No pertinent past medical history. Past Surgical History: Past Surgical History:  Procedure Laterality Date   ADENOIDECTOMY  2008   TONSILLECTOMY  2008   Family History: Family History  Problem Relation Age of Onset   Healthy Mother    Healthy Father    Irritable bowel syndrome Sister    Allergies: Allergies  Allergen Reactions   Penicillins Hives   Health Maintenance: Health Maintenance  Topic Date Due   COVID-19 Vaccine (1) Never done   HPV VACCINES (1 - Male 2-dose series) Never done   INFLUENZA VACCINE  06/10/2023   DTaP/Tdap/Td (2 - Td or Tdap) 05/10/2033   Hepatitis C Screening  Completed   HIV Screening  Completed    HM Colonoscopy     This patient has no relevant Health Maintenance data.      Medications: No current outpatient medications on file prior to visit.   No current facility-administered medications on file prior to visit.    Review of Systems: No headache, visual changes, nausea, vomiting, diarrhea, constipation, dizziness, abdominal pain, skin rash, fevers, chills, night sweats, swollen lymph nodes, weight loss, chest pain, body aches, joint swelling, muscle aches, shortness of breath, mood changes, visual or auditory hallucinations reported.  Objective:   Vitals:   05/11/23 0802  BP: 110/70  Pulse: 70  SpO2: 100%   Vitals:   05/11/23 0802  Weight: 133 lb (60.3 kg)  Height: 5\' 10"  (1.778 m)   Body mass index is 19.08 kg/m.  General: Well Developed, well  nourished, and in no acute distress.  Neuro: Alert and oriented x3, extra-ocular muscles intact, sensation grossly intact. Cranial nerves II through XII are grossly intact, motor, sensory, and coordinative functions are intact. HEENT: Normocephalic, atraumatic, pupils equal round reactive to light, neck supple, no masses, no lymphadenopathy, thyroid nonpalpable. Oropharynx, nasopharynx, external ear canals are unremarkable. Skin: Warm and dry, no rashes noted.  Cardiac: Regular rate and rhythm, no murmurs rubs or gallops. No peripheral edema. Pulses symmetric. Respiratory: Clear to auscultation bilaterally. Not using accessory muscles, speaking in full sentences.  Abdominal: Soft, nontender, nondistended, positive bowel sounds, no masses, no organomegaly. Musculoskeletal: Shoulder, elbow, wrist, hip, knee, ankle stable, and with full range of motion.   Impression and Recommendations:   The patient was counselled, risk factors were discussed, and anticipatory guidance given.  Problem List Items Addressed This Visit       Other   Need for Tdap vaccination    Administered today      Relevant Orders   Tdap vaccine greater than or equal to 7yo IM (Completed)   Comprehensive metabolic panel   Healthcare maintenance - Primary    Annual examination completed, risk stratification labs ordered, anticipatory guidance provided.  We will follow labs once resulted.      Relevant Orders   CBC   Comprehensive metabolic panel   Hepatitis C antibody   TSH   VITAMIN D 25 Hydroxy (Vit-D Deficiency, Fractures)   Lipid panel   Body mass index (BMI)  of 19.0-19.9 in adult   Other Visit Diagnoses     Screening for lipoid disorders       Relevant Orders   Comprehensive metabolic panel   Lipid panel   Need for hepatitis C screening test       Relevant Orders   Hepatitis C antibody        Orders & Medications Medications: No orders of the defined types were placed in this encounter.  Orders  Placed This Encounter  Procedures   Tdap vaccine greater than or equal to 7yo IM   CBC   Comprehensive metabolic panel   Hepatitis C antibody   TSH   VITAMIN D 25 Hydroxy (Vit-D Deficiency, Fractures)   Lipid panel     No follow-ups on file.    Jerrol Banana, MD, [facility]   Primary Care Sports Medicine Primary Care and Sports Medicine at [facility]

## 2023-05-11 NOTE — Assessment & Plan Note (Signed)
Annual examination completed, risk stratification labs ordered, anticipatory guidance provided.  We will follow labs once resulted. 

## 2023-05-12 LAB — COMPREHENSIVE METABOLIC PANEL WITH GFR
ALT: 10 [IU]/L (ref 0–44)
AST: 14 [IU]/L (ref 0–40)
Albumin: 4.8 g/dL (ref 4.3–5.2)
Alkaline Phosphatase: 75 [IU]/L (ref 51–125)
BUN/Creatinine Ratio: 14 (ref 9–20)
BUN: 13 mg/dL (ref 6–20)
Bilirubin Total: 0.3 mg/dL (ref 0.0–1.2)
CO2: 23 mmol/L (ref 20–29)
Calcium: 9.7 mg/dL (ref 8.7–10.2)
Chloride: 109 mmol/L — ABNORMAL HIGH (ref 96–106)
Creatinine, Ser: 0.94 mg/dL (ref 0.76–1.27)
Globulin, Total: 2 g/dL (ref 1.5–4.5)
Glucose: 85 mg/dL (ref 70–99)
Potassium: 4.8 mmol/L (ref 3.5–5.2)
Sodium: 146 mmol/L — ABNORMAL HIGH (ref 134–144)
Total Protein: 6.8 g/dL (ref 6.0–8.5)
eGFR: 119 mL/min/{1.73_m2}

## 2023-05-12 LAB — LIPID PANEL
Chol/HDL Ratio: 2.2 ratio (ref 0.0–5.0)
Cholesterol, Total: 121 mg/dL (ref 100–199)
HDL: 55 mg/dL (ref >39)
LDL Chol Calc (NIH): 57 mg/dL (ref 0–99)
Triglycerides: 31 mg/dL (ref 0–149)
VLDL Cholesterol Cal: 9 mg/dL (ref 5–40)

## 2023-05-12 LAB — CBC
Hematocrit: 42.2 % (ref 37.5–51.0)
Hemoglobin: 13.8 g/dL (ref 13.0–17.7)
MCH: 28.8 pg (ref 26.6–33.0)
MCHC: 32.7 g/dL (ref 31.5–35.7)
MCV: 88 fL (ref 79–97)
Platelets: 264 10*3/uL (ref 150–450)
RBC: 4.79 x10E6/uL (ref 4.14–5.80)
RDW: 12.5 % (ref 11.6–15.4)
WBC: 4.4 10*3/uL (ref 3.4–10.8)

## 2023-05-12 LAB — HEPATITIS C ANTIBODY: Hep C Virus Ab: NONREACTIVE

## 2023-05-12 LAB — VITAMIN D 25 HYDROXY (VIT D DEFICIENCY, FRACTURES): Vit D, 25-Hydroxy: 51.4 ng/mL (ref 30.0–100.0)

## 2023-05-12 LAB — TSH: TSH: 1.49 u[IU]/mL (ref 0.450–4.500)

## 2023-05-21 ENCOUNTER — Ambulatory Visit: Payer: 59 | Admitting: Podiatry

## 2023-05-21 ENCOUNTER — Encounter: Payer: Self-pay | Admitting: Podiatry

## 2023-05-21 VITALS — BP 115/71 | HR 62

## 2023-05-21 DIAGNOSIS — L600 Ingrowing nail: Secondary | ICD-10-CM | POA: Diagnosis not present

## 2023-05-21 NOTE — Progress Notes (Signed)
   Chief Complaint  Patient presents with   Nail Problem    "It's doing good."    Subjective: 22 y.o. male presents today status post permanent nail avulsion procedure of the lateral border right great toe that was performed on 04/30/2023.   No past medical history on file.  Objective: Neurovascular status intact.  Skin is warm, dry and supple. Nail and respective nail fold appears to be healing appropriately.   Assessment: #1 s/p partial permanent nail matrixectomy lateral border right great toe   Plan of care: #1 patient was evaluated  #2 light debridement of the periungual debris was performed to the border of the respective toe and nail plate using a tissue nipper. #3 patient is to return to clinic on a PRN basis.   Felecia Shelling, DPM [facility]  Dr. Felecia Shelling, DPM    [address] [place], [place] [address]                Office [phone]  Fax [phone]

## 2023-11-17 ENCOUNTER — Ambulatory Visit: Payer: 59 | Admitting: Family Medicine

## 2023-11-17 ENCOUNTER — Encounter: Payer: Self-pay | Admitting: Family Medicine

## 2023-11-17 VITALS — BP 110/60 | HR 72 | Ht 70.0 in | Wt 138.0 lb

## 2023-11-17 DIAGNOSIS — Z87898 Personal history of other specified conditions: Secondary | ICD-10-CM

## 2023-11-17 DIAGNOSIS — D170 Benign lipomatous neoplasm of skin and subcutaneous tissue of head, face and neck: Secondary | ICD-10-CM

## 2023-11-17 DIAGNOSIS — R198 Other specified symptoms and signs involving the digestive system and abdomen: Secondary | ICD-10-CM

## 2023-11-17 DIAGNOSIS — F439 Reaction to severe stress, unspecified: Secondary | ICD-10-CM

## 2023-11-17 NOTE — Progress Notes (Signed)
     Primary Care / Sports Medicine Office Visit  Patient Information:  Patient ID: James Roth, male DOB: Sep 24, 2001 Age: 23 y.o. MRN: [id number]   James Roth is a pleasant 23 y.o. male presenting with the following:  Chief Complaint  Patient presents with   Mass    Patient presents today to discuss a lump he has on the back of the right side of his neck. He has has this lump for about a month. He states it does not hurt.     Vitals:   11/17/23 1031  BP: 110/60  Pulse: 72  SpO2: 98%   Vitals:   11/17/23 1031  Weight: 138 lb (62.6 kg)  Height: 5' 10 (1.778 m)   Body mass index is 19.8 kg/m.  No results found.   Independent interpretation of notes and tests performed by another provider:   None  Procedures performed:   None  Pertinent History, Exam, Impression, and Recommendations:   Problem List Items Addressed This Visit     Abdominal complaints   The patient also reports experiencing stress and anxiety, which has been affecting their stomach. The patient has tried various medications for their stomach issues, but none have provided significant relief.   Gastrointestinal Symptoms - IBS concern Reports stomach issues potentially related to stress and anxiety. Previous treatments with Zofran , Bentyl , and Viberzi had limited effect. -Continue non-pharmacological interventions for stress and anxiety. -Consider further evaluation if symptoms persist or worsen.      Anxiety - Primary   Stress and Anxiety Reports work-related stress and anxiety, affecting sleep and potentially contributing to gastrointestinal symptoms. Discussed non-pharmacological interventions including physical activity, mindfulness, and behavioral therapy. -Encourage regular physical activity. -Provide information on mindfulness techniques. -Consider referral for behavioral therapy if symptoms persist or worsen.      Lipoma of neck   Presents with a spot that has been present for about a  month on the posterolateral right neck. The patient initially thought it was a lymph node. The spot has been growing in size and can be moved around, causing discomfort but no pain. The patient has not noticed any leakage or skin color changes around the spot.  Physical Exam SKIN: Palpable, round, minimally tender, mobile lymph node or ganglion cyst without overlying skin color change and without irregular contours noted.  Neck focal swelling Noticed for the past month, slightly increasing in size, mobile, no overlying skin changes, no pain, no drainage. Differential includes lymph node, ganglion cyst, or lipoma. -Discussed monitoring vs refer to dermatology for further evaluation and possible excision. Patient will monitor for now. Red flag symptoms conveyed.      Other Visit Diagnoses       History of motion sickness       Relevant Medications   scopolamine  (TRANSDERM-SCOP) 1 MG/3DAYS        Orders & Medications Medications:  Meds ordered this encounter  Medications   scopolamine  (TRANSDERM-SCOP) 1 MG/3DAYS    Sig: Place 1 patch (1.5 mg total) onto the skin every 3 (three) days.    Dispense:  10 patch    Refill:  0   No orders of the defined types were placed in this encounter.    No follow-ups on file.     Selinda JINNY Ku, MD, [facility]   Primary Care Sports Medicine Primary Care and Sports Medicine at [facility] [place]

## 2023-11-20 DIAGNOSIS — F419 Anxiety disorder, unspecified: Secondary | ICD-10-CM | POA: Insufficient documentation

## 2023-11-20 DIAGNOSIS — R198 Other specified symptoms and signs involving the digestive system and abdomen: Secondary | ICD-10-CM | POA: Insufficient documentation

## 2023-11-20 DIAGNOSIS — D170 Benign lipomatous neoplasm of skin and subcutaneous tissue of head, face and neck: Secondary | ICD-10-CM | POA: Insufficient documentation

## 2023-11-20 MED ORDER — SCOPOLAMINE 1 MG/3DAYS TD PT72
1.0000 | MEDICATED_PATCH | TRANSDERMAL | 0 refills | Status: AC
Start: 2023-11-20 — End: 2023-12-20

## 2023-11-20 NOTE — Assessment & Plan Note (Addendum)
 Stress and Anxiety Reports work-related stress and anxiety, affecting sleep and potentially contributing to gastrointestinal symptoms. Discussed non-pharmacological interventions including physical activity, mindfulness, and behavioral therapy. -Encourage regular physical activity. -Provide information on mindfulness techniques. -Consider referral for behavioral therapy if symptoms persist or worsen.

## 2023-11-20 NOTE — Patient Instructions (Signed)
 Your Plan:  - Neck nodule:   - We can refer you to a dermatologist for further evaluation and possible removal of the mass, contact us  if wanting this referral.   - Please monitor the neck mass for any changes such as rapid growth, pain, difficulty swallowing, or persistent fever, and let us  know if any of these occur.  - Stress and Anxiety:   - We discussed non-medication approaches such as regular physical activity, mindfulness techniques, and possibly behavioral therapy if your symptoms do not improve.  - Gastrointestinal Symptoms:   - Since previous medications have not been very effective, we will continue to focus on managing your stress and anxiety.   - If your stomach problems continue, we may need to do further tests.

## 2023-11-20 NOTE — Assessment & Plan Note (Addendum)
 The patient also reports experiencing stress and anxiety, which has been affecting their stomach. The patient has tried various medications for their stomach issues, but none have provided significant relief.   Gastrointestinal Symptoms - IBS concern Reports stomach issues potentially related to stress and anxiety. Previous treatments with Zofran , Bentyl , and Viberzi had limited effect. -Continue non-pharmacological interventions for stress and anxiety. -Consider further evaluation if symptoms persist or worsen.

## 2023-11-20 NOTE — Assessment & Plan Note (Addendum)
 Presents with a spot that has been present for about a month on the posterolateral right neck. The patient initially thought it was a lymph node. The spot has been growing in size and can be moved around, causing discomfort but no pain. The patient has not noticed any leakage or skin color changes around the spot.  Physical Exam SKIN: Palpable, round, minimally tender, mobile lymph node or ganglion cyst without overlying skin color change and without irregular contours noted.  Neck focal swelling Noticed for the past month, slightly increasing in size, mobile, no overlying skin changes, no pain, no drainage. Differential includes lymph node, ganglion cyst, or lipoma. -Discussed monitoring vs refer to dermatology for further evaluation and possible excision. Patient will monitor for now. Red flag symptoms conveyed.

## 2024-03-10 ENCOUNTER — Telehealth: Admitting: Family Medicine

## 2024-03-10 DIAGNOSIS — S80862A Insect bite (nonvenomous), left lower leg, initial encounter: Secondary | ICD-10-CM

## 2024-03-10 DIAGNOSIS — W57XXXA Bitten or stung by nonvenomous insect and other nonvenomous arthropods, initial encounter: Secondary | ICD-10-CM | POA: Diagnosis not present

## 2024-03-10 MED ORDER — DOXYCYCLINE HYCLATE 100 MG PO TABS: 100.0000 mg | ORAL_TABLET | Freq: Two times a day (BID) | ORAL | 0 refills | Status: AC

## 2024-03-10 NOTE — Progress Notes (Signed)
 E-Visit for Tick Bite  Thank you for describing your tick bite, Here is how we plan to help! Based on the information that you shared with me it looks like you have An infected or complicated tick bite that requires a longer course of antibiotics and will need for you to schedule a follow-up visit with a provider.  In most cases a tick bite is painless and does not itch.  Most tick bites in which the tick is quickly removed do not require prescriptions. Ticks can transmit several diseases if they are infected and remain attacked to your skin. Therefore the length that the tick was attached and any symptoms you have experienced after the bite are import to accurately develop your custom treatment plan. In most cases a single dose of doxycycline  may prevent the development of a more serious condition.  Based on your information I have sent doxycycline   Which ticks  are associated with illness?  The Wood Tick (dog tick) is the size of a watermelon seed and can sometimes transmit [facility] spotted fever and [place]  tick fever.   The Deer Tick (black-legged tick) is between the size of a poppy seed (pin head) and an apple seed, and can sometimes transmit Lyme disease.  A brown to black tick with a white splotch on its back is likely a male Amblyomma americanum ([place] tick). This tick has been associated with Southern Tick Associated illness ( STARI)  Lyme disease has become the most common tick-borne illness in the [place] . The risk of Lyme disease following a recognized deer tick bite is estimated to be 1%.  The majority of cases of Lyme disease start with a bull's eye rash at the site of the tick bite. The rash can occur days to weeks (typically 7-10 days) after a tick bite. Treatment with antibiotics is indicated if this rash appears. Flu-like symptoms may accompany the rash, including: fever, chills, headaches, muscle aches, and fatigue. Removing ticks promptly may prevent tick  borne disease.  What can be used to prevent Tick Bites?  Insect repellant with at leas 20% DEET. Wearing long pants with sock and shoes. Avoiding tall grass and heavily wooded areas. Checking your skin after being outdoors. Shower with a washcloth after outdoor exposures.  HOME CARE ADVICE FOR TICK BITE  Wood Tick Removal:  Use a pair of tweezers and grasp the wood tick close to the skin (on its head). Pull the wood tick straight upward without twisting or crushing it. Maintain a steady pressure until it releases its grip.   If tweezers aren't available, use fingers, a loop of thread around the jaws, or a needle between the jaws for traction.  Note: covering the tick with petroleum jelly, nail polish or rubbing alcohol doesn't work. Neither does touching the tick with a hot or cold object. Tiny Deer Tick Removal:   Needs to be scraped off with a knife blade or credit card edge. Place tick in a sealed container (e.g. glass jar, zip lock plastic bag), in case your doctor wants to see it. Tick's Head Removal:  If the wood tick's head breaks off in the skin, it must be removed. Clean the skin. Then use a sterile needle to uncover the head and lift it out or scrape it off.  If a very small piece of the head remains, the skin will eventually slough it off. Antibiotic Ointment:  Wash the wound and your hands with soap and water after removal to prevent catching  any tick disease.  Apply an over the counter antibiotic ointment (e.g. bacitracin) to the bite once. Expected Course: Tick bites normally don't itch or hurt. That's why they often go unnoticed. Call Your Doctor If:  You can't remove the tick or the tick's head Fever, a severe head ache, or rash occur in the next 2 weeks Bite begins to look infected Lyme's disease is common in your area You have not had a tetanus in the last 10 years Your current symptoms become worse    MAKE SURE YOU  Understand these instructions. Will watch your  condition. Will get help right away if you are not doing well or get worse.    Thank you for choosing an e-visit.  Your e-visit answers were reviewed by a board certified advanced clinical practitioner to complete your personal care plan. Depending upon the condition, your plan could have included both over the counter or prescription medications.  Please review your pharmacy choice. Make sure the pharmacy is open so you can pick up prescription now. If there is a problem, you may contact your provider through [employer] and have the prescription routed to another pharmacy.  Your safety is important to us . If you have drug allergies check your prescription carefully.   For the next 24 hours you can use MyChart to ask questions about today's visit, request a non-urgent call back, or ask for a work or school excuse. You will get an email in the next two days asking about your experience. I hope that your e-visit has been valuable and will speed your recovery.    have provided 5 minutes of non face to face time during this encounter for chart review and documentation.

## 2024-05-23 ENCOUNTER — Encounter: Payer: Self-pay | Admitting: Family Medicine

## 2024-10-19 ENCOUNTER — Telehealth: Admitting: Physician Assistant

## 2024-10-19 DIAGNOSIS — B354 Tinea corporis: Secondary | ICD-10-CM

## 2024-10-19 MED ORDER — CLOTRIMAZOLE-BETAMETHASONE 1-0.05 % EX CREA: 1.0000 | TOPICAL_CREAM | Freq: Every day | CUTANEOUS | 0 refills | Status: DC

## 2024-10-19 NOTE — Progress Notes (Signed)
 E Visit for Rash  We are sorry that you are not feeling well. Here is how we plan to help!  Based upon your presentation it appears you have a fungal infection.  I have prescribed: Lotrisone to apply twice daily for 2 weeks or until rash has fully cleared.   HOME CARE:  Take cool showers and avoid direct sunlight. Take an antihistamine like Benadryl for widespread rashes that itch.  The adult dose of Benadryl is 25-50 mg by mouth 4 times daily. Caution:  This type of medication may cause sleepiness.  Do not drink alcohol, drive, or operate dangerous machinery while taking antihistamines.  Do not take these medications if you have prostate enlargement.  Read package instructions thoroughly on all medications that you take.  GET HELP RIGHT AWAY IF:  Symptoms don't go away after treatment. Severe itching that persists. If you rash spreads or swells. If you rash begins to smell. If it blisters and opens or develops a yellow-brown crust. You develop a fever. You have a sore throat. You become short of breath.  MAKE SURE YOU:  Understand these instructions. Will watch your condition. Will get help right away if you are not doing well or get worse.  Thank you for choosing an e-visit. Your e-visit answers were reviewed by a board certified advanced clinical practitioner to complete your personal care plan. Depending upon the condition, your plan could have included both over the counter or prescription medications. Please review your pharmacy choice. Be sure that the pharmacy you have chosen is open so that you can pick up your prescription now.  If there is a problem you may message your provider in MyChart to have the prescription routed to another pharmacy. Your safety is important to us . If you have drug allergies check your prescription carefully.  For the next 24 hours, you can use MyChart to ask questions about todays visit, request a non-urgent call back, or ask for a work or  school excuse from your e-visit provider. You will get an email in the next two days asking about your experience. I hope that your e-visit has been valuable and will speed your recovery.  I have spent 5 minutes in review of e-visit questionnaire, review and updating patient chart, medical decision making and response to patient.   Elsie Velma Lunger, PA-C

## 2024-11-29 ENCOUNTER — Encounter: Payer: Self-pay | Admitting: Family Medicine

## 2024-11-29 ENCOUNTER — Ambulatory Visit: Admitting: Family Medicine

## 2024-11-29 VITALS — BP 100/56 | HR 73 | Ht 70.0 in | Wt 143.0 lb

## 2024-11-29 DIAGNOSIS — R198 Other specified symptoms and signs involving the digestive system and abdomen: Secondary | ICD-10-CM | POA: Diagnosis not present

## 2024-11-29 MED ORDER — LOPERAMIDE HCL 2 MG PO TABS: 2.0000 mg | ORAL_TABLET | Freq: Four times a day (QID) | ORAL | 1 refills | Status: AC | PRN

## 2024-11-29 MED ORDER — HYOSCYAMINE SULFATE 0.125 MG PO TABS: 0.1250 mg | ORAL_TABLET | ORAL | 3 refills | Status: AC | PRN

## 2024-11-29 MED ORDER — ONDANSETRON 4 MG PO TBDP: 4.0000 mg | ORAL_TABLET | Freq: Three times a day (TID) | ORAL | 0 refills | Status: AC | PRN

## 2024-11-29 NOTE — Progress Notes (Signed)
 "    Primary Care / Sports Medicine Office Visit  Patient Information:  Patient ID: James Roth, male DOB: 07/11/2001 Age: 24 y.o. MRN: [id number]   James Roth is a pleasant 24 y.o. male presenting with the following:  Chief Complaint  Patient presents with   Abdominal Pain    Abdominal pain x 1.5 weeks.     Vitals:   11/29/24 1022  BP: (!) 100/56  Pulse: 73  SpO2: 99%   Vitals:   11/29/24 1022  Weight: 143 lb (64.9 kg)  Height: 5' 10 (1.778 m)   Body mass index is 20.52 kg/m.  No results found.   Independent interpretation of notes and tests performed by another provider:   None  Procedures performed:   None  Pertinent History, Exam, Impression, and Recommendations:   Discussed the use of AI scribe software for clinical note transcription with the patient, who gave verbal consent to proceed.  History of Present Illness   James Roth is a 24 year old male with irritable bowel syndrome with diarrhea who presents for evaluation of an acute gastrointestinal flare.  Gastrointestinal Symptoms: - Persistent abdominal pain and cramping for one and a half weeks, severe enough to require bending over for comfort - Frequent loose stools during the peak of the flare; stool consistency has recently begun to improve - Increased burping and indigestion - Two episodes of dry heaving, a new symptom; no episodes of vomiting - Acidic foods worsen burping and indigestion, but dry heaving was not associated with acidic food intake  Impact on Daily Functioning: - Symptoms are particularly disruptive when waking up for work calls in the middle of the night - Significant interference with ability to perform at work due to urgent need to use the bathroom and pain  Prior Treatments and Current Management: - Prior concern for IBS-D - Previously trialed Bentyl , Viberzi, and Zofran  without significant relief - Not currently taking any of these medications and has not used any  therapy for this most recent flare - Maintains adequate fluid intake and continues to urinate - Noted some episodes of dark urine  Associated Factors: - No new stressors, changes in sleep, diet, or exposures in the two weeks prior to symptom onset - Baseline stress is always present     Physical Exam ABDOMEN: Normal active bowel sounds. Abdomen is soft, non-tender, and non-distended. No hepatosplenomegaly.  Assessment and Plan    Irritable bowel syndrome with diarrhea Acute flare of chronic IBS-D with severe symptoms affecting daily life. Differential includes inflammatory bowel disease, gastritis, and GERD. Gastroenterology evaluation needed. - Prescribed ondansetron  as needed for nausea. - Prescribed alternative antispasmodic for cramping and pain as needed. - Recommended loperamide  2 mg, up to 16 mg daily, 45 minutes before meals or as needed; advised titration to lowest effective dose. - Referred to gastroenterology for further evaluation, including possible endoscopy. - Advised hydration during increased diarrhea. - Provided low FODMAP list via MyChart for dietary triggers. - Recommended bland, easily digestible foods during flare; avoid spicy or heavy foods. - Scheduled follow-up in three months for progress review and routine labs. - Instructed to contact clinic if symptoms persist or worsen by next week for potential additional treatments.        Assessment & Plan Abdominal complaints  Orders:   loperamide  (IMODIUM  A-D) 2 MG tablet; Take 1-2 tablets (2-4 mg total) by mouth 4 (four) times daily as needed for diarrhea or loose stools.   hyoscyamine  (LEVSIN ) 0.125  MG tablet; Take 1-2 tablets (0.125-0.25 mg total) by mouth every 4 (four) hours as needed for cramping. maximum: 1.5 mg/day (12 tablets / day)   ondansetron  (ZOFRAN -ODT) 4 MG disintegrating tablet; Take 1 tablet (4 mg total) by mouth every 8 (eight) hours as needed for up to 10 days for nausea or vomiting.    Ambulatory referral to Gastroenterology   No follow-ups on file.     Selinda JINNY Ku, MD, [facility]   Primary Care Sports Medicine Primary Care and Sports Medicine at [facility]   "

## 2024-11-29 NOTE — Patient Instructions (Signed)
" °  VISIT SUMMARY: Today, we evaluated your acute gastrointestinal flare related to your irritable bowel syndrome with diarrhea (IBS-D).  YOUR PLAN: IRRITABLE BOWEL SYNDROME WITH DIARRHEA (IBS-D): You are experiencing a severe flare of your chronic IBS-D, which is significantly affecting your daily life. -Take ondansetron  as needed for nausea. -Use the prescribed alternative antispasmodic for cramping and pain as needed. -Take loperamide  2 mg, up to 16 mg daily, 45 minutes before meals or as needed. Adjust the dose to the lowest effective amount. -Stay hydrated, especially during periods of increased diarrhea. -Follow a low FODMAP diet to identify and avoid dietary triggers. A list has been provided via MyChart. -Eat bland, easily digestible foods during the flare and avoid spicy or heavy foods. -You have been referred to gastroenterology for further evaluation, including a possible endoscopy. -Schedule a follow-up appointment in three months for a progress review and routine labs. -Contact the clinic if your symptoms persist or worsen by next week for potential additional treatments.    Contains text generated by [employer].   "

## 2024-11-29 NOTE — Assessment & Plan Note (Signed)
" °  Orders:   loperamide  (IMODIUM  A-D) 2 MG tablet; Take 1-2 tablets (2-4 mg total) by mouth 4 (four) times daily as needed for diarrhea or loose stools.   hyoscyamine  (LEVSIN ) 0.125 MG tablet; Take 1-2 tablets (0.125-0.25 mg total) by mouth every 4 (four) hours as needed for cramping. maximum: 1.5 mg/day (12 tablets / day)   ondansetron  (ZOFRAN -ODT) 4 MG disintegrating tablet; Take 1 tablet (4 mg total) by mouth every 8 (eight) hours as needed for up to 10 days for nausea or vomiting.   Ambulatory referral to Gastroenterology  "

## 2025-02-26 ENCOUNTER — Encounter: Admitting: Family Medicine
